# Patient Record
Sex: Male | Born: 1995
Health system: Southern US, Community
[De-identification: ages and names within clinical notes are randomized; demographics above are authoritative.]

## PROBLEM LIST (undated history)

## (undated) DIAGNOSIS — S52501A Unspecified fracture of the lower end of right radius, initial encounter for closed fracture: Secondary | ICD-10-CM

## (undated) DIAGNOSIS — F840 Autistic disorder: Secondary | ICD-10-CM

## (undated) HISTORY — PX: ADENOIDECTOMY: SUR15

## (undated) HISTORY — PX: TONSILLECTOMY: SUR1361

---
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
## 2017-01-09 MED FILL — TRIAMCINOLONE 0.1% CREAM: 0.1 | 30 days supply | Qty: 454 | Fill #1

## 2017-01-17 MED FILL — hydrOXYzine HCL 25 MG TABS: 25 | 30 days supply | Qty: 60 | Fill #0

## 2017-01-17 MED FILL — ARIPiprazole 5 MG TABS: 5 | 30 days supply | Qty: 30 | Fill #1

## 2017-02-01 MED FILL — traZODone HCL 100 MG TABS: 100 | 30 days supply | Qty: 60 | Fill #1

## 2017-02-10 DIAGNOSIS — G4763 Sleep related bruxism: Secondary | ICD-10-CM | POA: Diagnosis not present

## 2017-02-10 DIAGNOSIS — H6503 Acute serous otitis media, bilateral: Secondary | ICD-10-CM | POA: Diagnosis not present

## 2017-02-17 MED FILL — ARIPiprazole 5 MG TABS: 5 | 30 days supply | Qty: 30 | Fill #0

## 2017-02-21 DIAGNOSIS — F39 Unspecified mood [affective] disorder: Secondary | ICD-10-CM | POA: Diagnosis not present

## 2017-02-21 DIAGNOSIS — G479 Sleep disorder, unspecified: Secondary | ICD-10-CM | POA: Diagnosis not present

## 2017-02-21 DIAGNOSIS — F418 Other specified anxiety disorders: Secondary | ICD-10-CM | POA: Diagnosis not present

## 2017-02-21 DIAGNOSIS — F840 Autistic disorder: Secondary | ICD-10-CM | POA: Diagnosis not present

## 2017-03-06 MED FILL — traZODone HCL 100 MG TABS: 100 | 30 days supply | Qty: 60 | Fill #2

## 2017-03-13 MED FILL — ARIPiprazole 5 MG TABS: 5 | 30 days supply | Qty: 30 | Fill #1

## 2017-04-10 MED FILL — traZODone HCL 100 MG TABS: 100 | 30 days supply | Qty: 60 | Fill #3

## 2017-04-21 MED FILL — ARIPiprazole 5 MG TABS: 5 | 90 days supply | Qty: 90 | Fill #0

## 2017-05-03 MED FILL — traZODone HCL 100 MG TABS: 100 | 90 days supply | Qty: 180 | Fill #0

## 2017-05-29 MED FILL — TRIAMCINOLONE ACETONIDE 0.1: 0.1 | 30 days supply | Qty: 454 | Fill #0

## 2017-06-09 DIAGNOSIS — G479 Sleep disorder, unspecified: Secondary | ICD-10-CM | POA: Diagnosis not present

## 2017-06-09 DIAGNOSIS — F840 Autistic disorder: Secondary | ICD-10-CM | POA: Diagnosis not present

## 2017-06-09 DIAGNOSIS — F39 Unspecified mood [affective] disorder: Secondary | ICD-10-CM | POA: Diagnosis not present

## 2017-06-09 DIAGNOSIS — F411 Generalized anxiety disorder: Secondary | ICD-10-CM | POA: Diagnosis not present

## 2017-07-05 ENCOUNTER — Telehealth: Payer: 59 | Admitting: Family

## 2017-07-05 DIAGNOSIS — B9789 Other viral agents as the cause of diseases classified elsewhere: Secondary | ICD-10-CM | POA: Diagnosis not present

## 2017-07-05 DIAGNOSIS — J329 Chronic sinusitis, unspecified: Secondary | ICD-10-CM

## 2017-07-05 MED ORDER — FLUTICASONE PROPIONATE 50 MCG/ACT NA SUSP: 2.0000 | Freq: Every day | NASAL | 6 refills | Status: AC

## 2017-07-05 MED ORDER — PREDNISONE 10 MG (21) PO TBPK: ORAL_TABLET | ORAL | 0 refills | Status: AC

## 2017-07-05 NOTE — Progress Notes (Signed)
We are sorry that you are not feeling well.  Here is how we plan to help!  Based on what you have shared with me it looks like you have sinusitis.  Sinusitis is inflammation and infection in the sinus cavities of the head.  Based on your presentation I believe you most likely have Acute Viral Sinusitis.This is an infection most likely caused by a virus. There is not specific treatment for viral sinusitis other than to help you with the symptoms until the infection runs its course.  You may use an oral decongestant such as Mucinex D or if you have glaucoma or high blood pressure use plain Mucinex. Saline nasal spray help and can safely be used as often as needed for congestion, I have prescribed: Fluticasone nasal spray two sprays in each nostril once a day and I have sent a prednisone dose  Pack to your pharmacy.  Some authorities believe that zinc sprays or the use of Echinacea may shorten the course of your symptoms.  Sinus infections are not as easily transmitted as other respiratory infection, however we still recommend that you avoid close contact with loved ones, especially the very young and elderly.  Remember to wash your hands thoroughly throughout the day as this is the number one way to prevent the spread of infection!  Home Care:  Only take medications as instructed by your medical team.  Do not take these medications with alcohol.  A steam or ultrasonic humidifier can help congestion.  You can place a towel over your head and breathe in the steam from hot water coming from a faucet.  Avoid close contacts especially the very young and the elderly.  Cover your mouth when you cough or sneeze.  Always remember to wash your hands.  Get Help Right Away If:  You develop worsening fever or sinus pain.  You develop a severe head ache or visual changes.  Your symptoms persist after you have completed your treatment plan.  Make sure you  Understand these instructions.  Will watch  your condition.  Will get help right away if you are not doing well or get worse.  Your e-visit answers were reviewed by a board certified advanced clinical practitioner to complete your personal care plan.  Depending on the condition, your plan could have included both over the counter or prescription medications.  If there is a problem please reply  once you have received a response from your provider.  Your safety is important to us.  If you have drug allergies check your prescription carefully.    You can use MyChart to ask questions about today's visit, request a non-urgent call back, or ask for a work or school excuse for 24 hours related to this e-Visit. If it has been greater than 24 hours you will need to follow up with your provider, or enter a new e-Visit to address those concerns.  You will get an e-mail in the next two days asking about your experience.  I hope that your e-visit has been valuable and will speed your recovery. Thank you for using e-visits.

## 2017-07-06 ENCOUNTER — Encounter: Payer: Self-pay | Admitting: Family Medicine

## 2017-07-06 ENCOUNTER — Ambulatory Visit: Payer: Self-pay | Admitting: Family Medicine

## 2017-07-06 VITALS — BP 130/70 | HR 104 | Temp 98.5°F | Resp 20

## 2017-07-06 DIAGNOSIS — H9203 Otalgia, bilateral: Secondary | ICD-10-CM

## 2017-07-06 DIAGNOSIS — J019 Acute sinusitis, unspecified: Secondary | ICD-10-CM

## 2017-07-06 MED ORDER — AMOXICILLIN-POT CLAVULANATE 875-125 MG PO TABS: 1.0000 | ORAL_TABLET | Freq: Two times a day (BID) | ORAL | 0 refills | Status: AC

## 2017-07-06 MED FILL — predniSONE 10 MG (21) TBPK: 10 | 6 days supply | Qty: 21 | Fill #0

## 2017-07-06 MED FILL — AMOX TR-K CLV 875-125 MG TA: 875-125 | 10 days supply | Qty: 20 | Fill #0

## 2017-07-06 MED FILL — FLUTICASONE PROP 50 MCG SPR: 50 | 30 days supply | Qty: 16 | Fill #0
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
## 2017-07-06 NOTE — Patient Instructions (Signed)
Antibiotic twice daily with food for 10 days Motrin 600 mg with food 3 times daily with food/milk for the next 3 days and then as needed for pain.  Sinusitis, Adult Sinusitis is soreness and inflammation of your sinuses. Sinuses are hollow spaces in the bones around your face. They are located:  Around your eyes.  In the middle of your forehead.  Behind your nose.  In your cheekbones.  Your sinuses and nasal passages are lined with a stringy fluid (mucus). Mucus normally drains out of your sinuses. When your nasal tissues get inflamed or swollen, the mucus can get trapped or blocked so air cannot flow through your sinuses. This lets bacteria, viruses, and funguses grow, and that leads to infection. Follow these instructions at home: Medicines  Take, use, or apply over-the-counter and prescription medicines only as told by your doctor. These may include nasal sprays.  If you were prescribed an antibiotic medicine, take it as told by your doctor. Do not stop taking the antibiotic even if you start to feel better. Hydrate and Humidify  Drink enough water to keep your pee (urine) clear or pale yellow.  Use a cool mist humidifier to keep the humidity level in your home above 50%.  Breathe in steam for 10-15 minutes, 3-4 times a day or as told by your doctor. You can do this in the bathroom while a hot shower is running.  Try not to spend time in cool or dry air. Rest  Rest as much as possible.  Sleep with your head raised (elevated).  Make sure to get enough sleep each night. General instructions  Put a warm, moist washcloth on your face 3-4 times a day or as told by your doctor. This will help with discomfort.  Wash your hands often with soap and water. If there is no soap and water, use hand sanitizer.  Do not smoke. Avoid being around people who are smoking (secondhand smoke).  Keep all follow-up visits as told by your doctor. This is important. Contact a doctor if:  You  have a fever.  Your symptoms get worse.  Your symptoms do not get better within 10 days. Get help right away if:  You have a very bad headache.  You cannot stop throwing up (vomiting).  You have pain or swelling around your face or eyes.  You have trouble seeing.  You feel confused.  Your neck is stiff.  You have trouble breathing. This information is not intended to replace advice given to you by your health care provider. Make sure you discuss any questions you have with your health care provider. Document Released: 10/19/2007 Document Revised: 12/27/2015 Document Reviewed: 02/25/2015 Elsevier Interactive Patient Education  [employer]2018 [employer] Inc[employer]

## 2017-07-11 ENCOUNTER — Telehealth: Payer: Self-pay

## 2017-07-13 MED FILL — hydrOXYzine HCL 25 MG TABS: 25 | 30 days supply | Qty: 60 | Fill #1

## 2017-07-26 MED FILL — ARIPiprazole 5 MG TABS: 5 | 90 days supply | Qty: 90 | Fill #1

## 2017-09-06 MED FILL — traZODone HCL 100 MG TABS: 100 | 90 days supply | Qty: 180 | Fill #1 | Status: TO

## 2017-10-23 MED FILL — ARIPiprazole 5 MG TABS: 5 | 30 days supply | Qty: 30 | Fill #0

## 2017-11-10 MED FILL — ARIPiprazole 5 MG TABS: 5 | 30 days supply | Qty: 45 | Fill #1

## 2017-11-14 MED FILL — TRIAMCINOLONE 0.1% CREAM: 0.1 | 30 days supply | Qty: 454 | Fill #0

## 2017-12-18 DIAGNOSIS — F411 Generalized anxiety disorder: Secondary | ICD-10-CM | POA: Diagnosis not present

## 2017-12-18 DIAGNOSIS — F39 Unspecified mood [affective] disorder: Secondary | ICD-10-CM | POA: Diagnosis not present

## 2017-12-18 DIAGNOSIS — F840 Autistic disorder: Secondary | ICD-10-CM | POA: Diagnosis not present

## 2017-12-19 MED FILL — ARIPiprazole 5 MG TABS: 5 | 30 days supply | Qty: 45 | Fill #2

## 2018-01-18 MED FILL — traZODone HCL 100 MG TABS: 100 | 90 days supply | Qty: 180 | Fill #0

## 2018-02-09 MED FILL — ARIPiprazole 5 MG TABS: 5 | 30 days supply | Qty: 45 | Fill #3

## 2018-02-13 ENCOUNTER — Ambulatory Visit (INDEPENDENT_AMBULATORY_CARE_PROVIDER_SITE_OTHER): Payer: 59 | Admitting: Clinical

## 2018-02-13 DIAGNOSIS — F840 Autistic disorder: Secondary | ICD-10-CM | POA: Diagnosis not present

## 2018-03-12 MED FILL — ARIPiprazole 5 MG TABS: 5 | 30 days supply | Qty: 45 | Fill #4

## 2018-03-14 ENCOUNTER — Ambulatory Visit: Payer: 59 | Admitting: Medical

## 2018-03-14 DIAGNOSIS — Z0289 Encounter for other administrative examinations: Secondary | ICD-10-CM

## 2018-03-16 DIAGNOSIS — B360 Pityriasis versicolor: Secondary | ICD-10-CM | POA: Diagnosis not present

## 2018-03-16 DIAGNOSIS — F840 Autistic disorder: Secondary | ICD-10-CM | POA: Diagnosis not present

## 2018-03-16 DIAGNOSIS — L209 Atopic dermatitis, unspecified: Secondary | ICD-10-CM | POA: Diagnosis not present

## 2018-03-16 DIAGNOSIS — Z23 Encounter for immunization: Secondary | ICD-10-CM | POA: Diagnosis not present

## 2018-03-16 DIAGNOSIS — F79 Unspecified intellectual disabilities: Secondary | ICD-10-CM | POA: Diagnosis not present

## 2018-03-16 DIAGNOSIS — Z0001 Encounter for general adult medical examination with abnormal findings: Secondary | ICD-10-CM | POA: Diagnosis not present

## 2018-03-16 DIAGNOSIS — Z7689 Persons encountering health services in other specified circumstances: Secondary | ICD-10-CM | POA: Diagnosis not present

## 2018-03-16 MED FILL — TRIAMCINOLONE 0.1% OINTMENT: 0.1 | 30 days supply | Qty: 80 | Fill #0

## 2018-03-19 MED FILL — SELENIUM SULF 2.5% SHAMPOO: 2.5 | 30 days supply | Qty: 118 | Fill #0

## 2018-03-26 ENCOUNTER — Ambulatory Visit (INDEPENDENT_AMBULATORY_CARE_PROVIDER_SITE_OTHER): Payer: 59 | Admitting: Clinical

## 2018-03-26 DIAGNOSIS — F840 Autistic disorder: Secondary | ICD-10-CM

## 2018-04-06 DIAGNOSIS — M722 Plantar fascial fibromatosis: Secondary | ICD-10-CM | POA: Diagnosis not present

## 2018-04-06 DIAGNOSIS — M7989 Other specified soft tissue disorders: Secondary | ICD-10-CM | POA: Diagnosis not present

## 2018-04-06 DIAGNOSIS — M2042 Other hammer toe(s) (acquired), left foot: Secondary | ICD-10-CM | POA: Diagnosis not present

## 2018-04-06 DIAGNOSIS — M2041 Other hammer toe(s) (acquired), right foot: Secondary | ICD-10-CM | POA: Diagnosis not present

## 2018-04-18 ENCOUNTER — Telehealth: Payer: Self-pay

## 2018-04-18 NOTE — Telephone Encounter (Signed)
Copied from CRM [phone]. Topic: General - Inquiry >> Apr 18, 2018  2:13 PM Crist InfanteHarrald, Kathy J wrote: Reason for CRM: pt was advised they could not be seen because they had medicaid as secondary insurance. Mom states she did not want to argue, so they went to another dr office. But the main thing she is upset about, it that she got a no show fee for this appt, and she was told he could not be seen, and the girl that checked pt in was supposed to cancel the appt. Please credit no show fee

## 2018-04-20 MED FILL — ARIPiprazole 5 MG TABS: 5 | 30 days supply | Qty: 45 | Fill #5

## 2018-04-24 MED FILL — TRINTELLIX 10 MG TABLET: 10 | 30 days supply | Qty: 30 | Fill #0

## 2018-04-24 MED FILL — traZODone HCL 100 MG TABS: 100 | 90 days supply | Qty: 180 | Fill #1

## 2018-05-14 NOTE — Telephone Encounter (Signed)
Patients mother is calling in stating she is still receiving bills for the no show fee.

## 2018-05-21 MED FILL — TRINTELLIX 10 MG TABLET: 10 | 30 days supply | Qty: 30 | Fill #1

## 2018-05-31 ENCOUNTER — Ambulatory Visit: Payer: 59 | Admitting: Clinical

## 2018-06-01 DIAGNOSIS — L209 Atopic dermatitis, unspecified: Secondary | ICD-10-CM | POA: Diagnosis not present

## 2018-06-01 DIAGNOSIS — B360 Pityriasis versicolor: Secondary | ICD-10-CM | POA: Diagnosis not present

## 2018-06-01 DIAGNOSIS — B079 Viral wart, unspecified: Secondary | ICD-10-CM | POA: Diagnosis not present

## 2018-06-01 MED FILL — TRIAMCINOLONE 0.1% OINTMENT: 0.1 | 30 days supply | Qty: 80 | Fill #0

## 2018-06-01 MED FILL — SELENIUM SULF 2.5% LOTION: 2.5 | 30 days supply | Qty: 120 | Fill #0

## 2018-06-05 MED FILL — ARIPiprazole 5 MG TABS: 5 | 10 days supply | Qty: 15 | Fill #6

## 2018-06-18 MED FILL — FLUTICASONE PROP 50 MCG SPR: 50 | 30 days supply | Qty: 16 | Fill #0

## 2018-06-19 DIAGNOSIS — F411 Generalized anxiety disorder: Secondary | ICD-10-CM | POA: Diagnosis not present

## 2018-06-19 DIAGNOSIS — F79 Unspecified intellectual disabilities: Secondary | ICD-10-CM | POA: Diagnosis not present

## 2018-06-19 DIAGNOSIS — F429 Obsessive-compulsive disorder, unspecified: Secondary | ICD-10-CM | POA: Diagnosis not present

## 2018-06-19 DIAGNOSIS — G479 Sleep disorder, unspecified: Secondary | ICD-10-CM | POA: Diagnosis not present

## 2018-06-19 DIAGNOSIS — F840 Autistic disorder: Secondary | ICD-10-CM | POA: Diagnosis not present

## 2018-06-22 MED FILL — TRINTELLIX 10 MG TABLET: 10 | 30 days supply | Qty: 30 | Fill #0

## 2018-06-27 MED FILL — ARIPiprazole 15 MG TABS: 15 | 30 days supply | Qty: 30 | Fill #0

## 2018-07-17 MED FILL — TRINTELLIX 10 MG TABLET: 10 | 30 days supply | Qty: 30 | Fill #1

## 2018-07-18 MED FILL — lamoTRIgine 25 MG TABS: 25 | 30 days supply | Qty: 75 | Fill #0

## 2018-08-15 MED FILL — lamoTRIgine 25 MG TABS: 25 | 23 days supply | Qty: 180 | Fill #0

## 2018-09-06 DIAGNOSIS — F70 Mild intellectual disabilities: Secondary | ICD-10-CM | POA: Diagnosis not present

## 2018-09-06 DIAGNOSIS — F429 Obsessive-compulsive disorder, unspecified: Secondary | ICD-10-CM | POA: Diagnosis not present

## 2018-09-06 DIAGNOSIS — F840 Autistic disorder: Secondary | ICD-10-CM | POA: Diagnosis not present

## 2018-09-06 DIAGNOSIS — F4011 Social phobia, generalized: Secondary | ICD-10-CM | POA: Diagnosis not present

## 2018-09-06 MED FILL — traZODone HCL 150 MG TABS: 150 | 30 days supply | Qty: 30 | Fill #0

## 2018-09-06 MED FILL — PROPRANOLOL 10 MG TABLET: 10 | 30 days supply | Qty: 60 | Fill #0

## 2018-09-06 MED FILL — ARIPiprazole 5 MG TABS: 5 | 30 days supply | Qty: 30 | Fill #0

## 2018-10-01 DIAGNOSIS — F70 Mild intellectual disabilities: Secondary | ICD-10-CM | POA: Diagnosis not present

## 2018-10-01 DIAGNOSIS — F840 Autistic disorder: Secondary | ICD-10-CM | POA: Diagnosis not present

## 2018-10-01 DIAGNOSIS — F429 Obsessive-compulsive disorder, unspecified: Secondary | ICD-10-CM | POA: Diagnosis not present

## 2018-10-01 DIAGNOSIS — F4011 Social phobia, generalized: Secondary | ICD-10-CM | POA: Diagnosis not present

## 2018-10-01 MED FILL — CARBAMAZEPINE ER 300 MG CAP: 300 | 30 days supply | Qty: 60 | Fill #0

## 2018-10-02 MED FILL — PROPRANOLOL 20 MG TABLET: 20 | 30 days supply | Qty: 60 | Fill #0

## 2018-10-09 MED FILL — traZODone HCL 150 MG TABS: 150 | 30 days supply | Qty: 30 | Fill #1

## 2018-10-17 MED FILL — CARBAMAZEPINE ER 400 MG TAB: 400 | 30 days supply | Qty: 60 | Fill #0

## 2018-10-31 MED FILL — PROPRANOLOL 20 MG TABLET: 20 | 30 days supply | Qty: 60 | Fill #1

## 2018-11-12 MED FILL — EQUETRO 300 MG CAPSULE: 300 | 30 days supply | Qty: 60 | Fill #0

## 2018-11-19 MED FILL — traZODone HCL 150 MG TABS: 150 | 30 days supply | Qty: 30 | Fill #0

## 2018-11-28 DIAGNOSIS — F4011 Social phobia, generalized: Secondary | ICD-10-CM | POA: Diagnosis not present

## 2018-11-28 DIAGNOSIS — F429 Obsessive-compulsive disorder, unspecified: Secondary | ICD-10-CM | POA: Diagnosis not present

## 2018-11-28 DIAGNOSIS — F70 Mild intellectual disabilities: Secondary | ICD-10-CM | POA: Diagnosis not present

## 2018-11-28 DIAGNOSIS — F840 Autistic disorder: Secondary | ICD-10-CM | POA: Diagnosis not present

## 2018-11-28 MED FILL — PROPRANOLOL 20 MG TABLET: 20 | 30 days supply | Qty: 90 | Fill #0

## 2018-12-13 MED FILL — traZODone HCL 150 MG TABS: 150 | 30 days supply | Qty: 30 | Fill #1

## 2018-12-13 MED FILL — EQUETRO 300 MG CAPSULE: 300 | 30 days supply | Qty: 60 | Fill #1

## 2018-12-26 MED FILL — PROPRANOLOL 20 MG TABLET: 20 | 30 days supply | Qty: 90 | Fill #1

## 2018-12-29 ENCOUNTER — Ambulatory Visit (INDEPENDENT_AMBULATORY_CARE_PROVIDER_SITE_OTHER): Payer: 59 | Admitting: Clinical

## 2018-12-29 DIAGNOSIS — F840 Autistic disorder: Secondary | ICD-10-CM | POA: Diagnosis not present

## 2019-01-07 MED FILL — EQUETRO 300 MG CAPSULE: 300 | 30 days supply | Qty: 60 | Fill #0

## 2019-01-22 MED FILL — traZODone HCL 150 MG TABS: 150 | 30 days supply | Qty: 30 | Fill #0

## 2019-01-24 DIAGNOSIS — F4011 Social phobia, generalized: Secondary | ICD-10-CM | POA: Diagnosis not present

## 2019-01-24 DIAGNOSIS — F70 Mild intellectual disabilities: Secondary | ICD-10-CM | POA: Diagnosis not present

## 2019-01-24 DIAGNOSIS — F429 Obsessive-compulsive disorder, unspecified: Secondary | ICD-10-CM | POA: Diagnosis not present

## 2019-01-24 DIAGNOSIS — F840 Autistic disorder: Secondary | ICD-10-CM | POA: Diagnosis not present

## 2019-01-24 MED FILL — PROPRANOLOL HCL ER 60 MG CP: 60 | 30 days supply | Qty: 30 | Fill #0

## 2019-02-03 ENCOUNTER — Ambulatory Visit: Payer: 59 | Admitting: Clinical

## 2019-02-07 DIAGNOSIS — B078 Other viral warts: Secondary | ICD-10-CM | POA: Diagnosis not present

## 2019-02-07 DIAGNOSIS — L700 Acne vulgaris: Secondary | ICD-10-CM | POA: Diagnosis not present

## 2019-02-07 DIAGNOSIS — L2089 Other atopic dermatitis: Secondary | ICD-10-CM | POA: Diagnosis not present

## 2019-02-07 DIAGNOSIS — L309 Dermatitis, unspecified: Secondary | ICD-10-CM | POA: Diagnosis not present

## 2019-02-07 MED FILL — TRIAMCINOLONE 0.1% CREAM: 0.1 | 30 days supply | Qty: 454 | Fill #0

## 2019-02-11 MED FILL — EQUETRO 300 MG CAPSULE: 300 | 30 days supply | Qty: 60 | Fill #1

## 2019-02-12 MED FILL — EUCRISA 2% OINTMENT: 2 | 30 days supply | Qty: 60 | Fill #0

## 2019-02-18 MED FILL — traZODone HCL 150 MG TABS: 150 | 30 days supply | Qty: 30 | Fill #1

## 2019-02-19 DIAGNOSIS — F840 Autistic disorder: Secondary | ICD-10-CM | POA: Diagnosis not present

## 2019-02-19 DIAGNOSIS — F429 Obsessive-compulsive disorder, unspecified: Secondary | ICD-10-CM | POA: Diagnosis not present

## 2019-02-19 DIAGNOSIS — F70 Mild intellectual disabilities: Secondary | ICD-10-CM | POA: Diagnosis not present

## 2019-02-19 DIAGNOSIS — F4011 Social phobia, generalized: Secondary | ICD-10-CM | POA: Diagnosis not present

## 2019-02-28 MED FILL — PROPRANOLOL HCL ER 80 MG CP: 80 | 30 days supply | Qty: 30 | Fill #0

## 2019-03-11 MED FILL — EQUETRO 300 MG CAPSULE: 300 | 30 days supply | Qty: 60 | Fill #0

## 2019-03-25 MED FILL — traZODone HCL 150 MG TABS: 150 | 30 days supply | Qty: 30 | Fill #0

## 2019-03-27 MED FILL — PROPRANOLOL HCL ER 80 MG CP: 80 | 30 days supply | Qty: 30 | Fill #1

## 2019-04-10 MED FILL — EQUETRO 300 MG CAPSULE: 300 | 30 days supply | Qty: 60 | Fill #1

## 2019-04-17 DIAGNOSIS — F840 Autistic disorder: Secondary | ICD-10-CM | POA: Diagnosis not present

## 2019-04-17 DIAGNOSIS — F4011 Social phobia, generalized: Secondary | ICD-10-CM | POA: Diagnosis not present

## 2019-04-17 DIAGNOSIS — F70 Mild intellectual disabilities: Secondary | ICD-10-CM | POA: Diagnosis not present

## 2019-04-17 DIAGNOSIS — F429 Obsessive-compulsive disorder, unspecified: Secondary | ICD-10-CM | POA: Diagnosis not present

## 2019-04-22 MED FILL — PROPRANOLOL HCL ER 80 MG CP: 80 | 30 days supply | Qty: 30 | Fill #0

## 2019-04-22 MED FILL — traZODone HCL 150 MG TABS: 150 | 30 days supply | Qty: 30 | Fill #1

## 2019-05-07 MED FILL — EQUETRO 300 MG CAPSULE: 300 | 30 days supply | Qty: 60 | Fill #0

## 2019-05-24 MED FILL — PROPRANOLOL HCL ER 80 MG CP: 80 | 30 days supply | Qty: 30 | Fill #1

## 2019-05-24 MED FILL — traZODone HCL 150 MG TABS: 150 | 30 days supply | Qty: 30 | Fill #0

## 2019-06-10 MED FILL — EQUETRO 300 MG CAPSULE: 300 | 30 days supply | Qty: 60 | Fill #1

## 2019-06-25 MED FILL — PROPRANOLOL HCL ER 80 MG CP: 80 | 30 days supply | Qty: 30 | Fill #2

## 2019-06-25 NOTE — Telephone Encounter (Signed)
Error

## 2019-06-27 MED FILL — traZODone HCL 150 MG TABS: 150 | 30 days supply | Qty: 30 | Fill #1

## 2019-07-11 DIAGNOSIS — F429 Obsessive-compulsive disorder, unspecified: Secondary | ICD-10-CM | POA: Diagnosis not present

## 2019-07-11 DIAGNOSIS — F4011 Social phobia, generalized: Secondary | ICD-10-CM | POA: Diagnosis not present

## 2019-07-11 DIAGNOSIS — F840 Autistic disorder: Secondary | ICD-10-CM | POA: Diagnosis not present

## 2019-07-11 DIAGNOSIS — F70 Mild intellectual disabilities: Secondary | ICD-10-CM | POA: Diagnosis not present

## 2019-07-11 MED FILL — traZODone HCL 100 MG TABS: 100 | 30 days supply | Qty: 60 | Fill #0

## 2019-07-11 MED FILL — EQUETRO 300 MG CAPSULE: 300 | 30 days supply | Qty: 60 | Fill #0

## 2019-07-31 MED FILL — PROPRANOLOL HCL ER 80 MG CP: 80 | 30 days supply | Qty: 30 | Fill #0

## 2019-08-07 MED FILL — EQUETRO 300 MG CAPSULE: 300 | 30 days supply | Qty: 60 | Fill #1

## 2019-08-09 MED FILL — traZODone HCL 100 MG TABS: 100 | 30 days supply | Qty: 60 | Fill #1

## 2019-08-27 MED FILL — PROPRANOLOL HCL ER 80 MG CP: 80 | 30 days supply | Qty: 30 | Fill #1

## 2019-09-09 MED FILL — EQUETRO 300 MG CAPSULE: 300 | 30 days supply | Qty: 60 | Fill #2

## 2019-10-04 MED FILL — EQUETRO 300 MG CAPSULE: 300 | 30 days supply | Qty: 60 | Fill #2

## 2019-11-04 MED FILL — PROPRANOLOL HCL ER 80 MG CP: 80 | 30 days supply | Qty: 30 | Fill #0

## 2019-11-06 MED FILL — traZODone HCL 150 MG TABS: 150 | 30 days supply | Qty: 30 | Fill #0

## 2019-11-06 MED FILL — EQUETRO 300 MG CAPSULE: 300 | 30 days supply | Qty: 60 | Fill #0

## 2019-11-22 DIAGNOSIS — F4011 Social phobia, generalized: Secondary | ICD-10-CM | POA: Diagnosis not present

## 2019-11-22 DIAGNOSIS — F70 Mild intellectual disabilities: Secondary | ICD-10-CM | POA: Diagnosis not present

## 2019-11-22 DIAGNOSIS — F840 Autistic disorder: Secondary | ICD-10-CM | POA: Diagnosis not present

## 2019-11-22 DIAGNOSIS — F429 Obsessive-compulsive disorder, unspecified: Secondary | ICD-10-CM | POA: Diagnosis not present

## 2019-11-22 MED FILL — traZODone HCL 100 MG TABS: 100 | 30 days supply | Qty: 60 | Fill #0

## 2019-11-30 MED FILL — PROPRANOLOL HCL ER 80 MG CP: 80 | 30 days supply | Qty: 30 | Fill #0

## 2019-12-09 MED FILL — TRIAMCINOLONE ACETONIDE 0.1: 0.1 | 30 days supply | Qty: 454 | Fill #1

## 2019-12-09 MED FILL — EQUETRO 300 MG CAPSULE: 300 | 30 days supply | Qty: 60 | Fill #1

## 2019-12-23 MED FILL — traZODone HCL 100 MG TABS: 100 | 30 days supply | Qty: 60 | Fill #1

## 2019-12-31 MED FILL — PROPRANOLOL HCL ER 80 MG CP: 80 | 30 days supply | Qty: 30 | Fill #1

## 2020-01-28 MED FILL — traZODone HCL 100 MG TABS: 100 | 30 days supply | Qty: 60 | Fill #2

## 2020-01-30 MED FILL — PROPRANOLOL HCL ER 80 MG CP: 80 | 30 days supply | Qty: 30 | Fill #2

## 2020-02-10 MED FILL — EQUETRO 300 MG CAPSULE: 300 | 30 days supply | Qty: 60 | Fill #0

## 2020-02-11 DIAGNOSIS — F70 Mild intellectual disabilities: Secondary | ICD-10-CM | POA: Diagnosis not present

## 2020-02-11 DIAGNOSIS — F4011 Social phobia, generalized: Secondary | ICD-10-CM | POA: Diagnosis not present

## 2020-02-11 DIAGNOSIS — F840 Autistic disorder: Secondary | ICD-10-CM | POA: Diagnosis not present

## 2020-02-11 DIAGNOSIS — F429 Obsessive-compulsive disorder, unspecified: Secondary | ICD-10-CM | POA: Diagnosis not present

## 2020-02-11 MED FILL — traZODone HCL 300 MG TABS: 300 | 30 days supply | Qty: 30 | Fill #0

## 2020-02-26 MED FILL — traZODone HCL 150 MG TABS: 150 | 30 days supply | Qty: 30 | Fill #1

## 2020-02-28 MED FILL — PROPRANOLOL HCL ER 80 MG CP: 80 | 30 days supply | Qty: 30 | Fill #0

## 2020-05-05 MED FILL — traZODone HCL 300 MG TABS: 300 | 30 days supply | Qty: 30 | Fill #0
